# Patient Record
Sex: Female | Born: 1970 | Race: Black or African American | Hispanic: No | Marital: Single | Smoking: Never smoker
Health system: Southern US, Community
[De-identification: ages and names within clinical notes are randomized; demographics above are authoritative.]

---

## 2015-05-04 HISTORY — PX: OTHER SURGICAL HISTORY: SHX169

## 2021-01-03 ENCOUNTER — Encounter (HOSPITAL_COMMUNITY): Payer: Self-pay | Admitting: *Deleted

## 2021-01-03 ENCOUNTER — Other Ambulatory Visit: Payer: Self-pay

## 2021-01-03 ENCOUNTER — Emergency Department (HOSPITAL_COMMUNITY): Payer: No Typology Code available for payment source

## 2021-01-03 ENCOUNTER — Emergency Department (HOSPITAL_COMMUNITY)
Admission: EM | Admit: 2021-01-03 | Discharge: 2021-01-04 | Disposition: A | Discharge status: Home / Self Care | Payer: No Typology Code available for payment source | Attending: Emergency Medicine | Admitting: Emergency Medicine

## 2021-01-03 DIAGNOSIS — Y92410 Unspecified street and highway as the place of occurrence of the external cause: Secondary | ICD-10-CM | POA: Insufficient documentation

## 2021-01-03 DIAGNOSIS — M549 Dorsalgia, unspecified: Secondary | ICD-10-CM | POA: Diagnosis not present

## 2021-01-03 DIAGNOSIS — M25561 Pain in right knee: Secondary | ICD-10-CM

## 2021-01-03 MED ORDER — ACETAMINOPHEN 325 MG PO TABS
650.0000 mg | ORAL_TABLET | Freq: Once | ORAL | Status: AC
  Administered 2021-01-03: 650 mg via ORAL
  Filled 2021-01-03: qty 2
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
## 2021-01-04 MED ORDER — IBUPROFEN 400 MG PO TABS
600.0000 mg | ORAL_TABLET | Freq: Once | ORAL | Status: AC
  Administered 2021-01-04: 600 mg via ORAL
  Filled 2021-01-04: qty 1

## 2021-01-04 MED ORDER — IBUPROFEN 600 MG PO TABS: 600.0000 mg | ORAL_TABLET | Freq: Four times a day (QID) | ORAL | 0 refills | Status: AC | PRN

## 2021-01-04 MED ORDER — METHOCARBAMOL 500 MG PO TABS: 500.0000 mg | ORAL_TABLET | Freq: Two times a day (BID) | ORAL | 0 refills | Status: AC

## 2021-01-04 NOTE — Discharge Instructions (Addendum)
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
## 2021-02-01 ENCOUNTER — Encounter (HOSPITAL_BASED_OUTPATIENT_CLINIC_OR_DEPARTMENT_OTHER): Payer: Self-pay

## 2021-02-01 ENCOUNTER — Other Ambulatory Visit: Payer: Self-pay

## 2021-02-01 ENCOUNTER — Emergency Department (HOSPITAL_BASED_OUTPATIENT_CLINIC_OR_DEPARTMENT_OTHER)
Admission: EM | Admit: 2021-02-01 | Discharge: 2021-02-02 | Disposition: A | Discharge status: Home / Self Care | Payer: Self-pay | Attending: Emergency Medicine | Admitting: Emergency Medicine

## 2021-02-01 DIAGNOSIS — R350 Frequency of micturition: Secondary | ICD-10-CM | POA: Insufficient documentation

## 2021-02-01 LAB — URINALYSIS, ROUTINE W REFLEX MICROSCOPIC
Bilirubin Urine: NEGATIVE
Glucose, UA: NEGATIVE mg/dL
Hgb urine dipstick: NEGATIVE
Ketones, ur: NEGATIVE mg/dL
Leukocytes,Ua: NEGATIVE
Nitrite: NEGATIVE
Protein, ur: NEGATIVE mg/dL
Specific Gravity, Urine: 1.03 (ref 1.005–1.030)
pH: 5.5 (ref 5.0–8.0)

## 2021-02-01 NOTE — ED Triage Notes (Signed)
Pt reports urinary frequency x 2 wks - denies fever/  abd pain

## 2021-02-02 NOTE — ED Notes (Signed)
This RN presented the AVS utilizing Teachback Method. Patient verbalizes understanding of Discharge Instructions. Opportunity for Questioning and Answers were provided. Patient Discharged from ED ambulatory to Home.   

## 2021-02-02 NOTE — ED Provider Notes (Signed)
[facility] EMERGENCY DEPT Provider Note  CSN: [id number] Arrival date & time: 02/01/21 2259  Chief Complaint(s) Urinary Frequency  HPI Priscilla Little is a 50 y.o. female    Urinary Frequency This is a new problem. Episode onset: 2 week. The problem occurs daily. The problem has not changed since onset.Pertinent negatives include no chest pain, no abdominal pain, no headaches and no shortness of breath. Nothing aggravates the symptoms. Nothing relieves the symptoms. She has tried nothing for the symptoms.   Past Medical History History reviewed. No pertinent past medical history. There are no problems to display for this patient.  Home Medication(s) Prior to Admission medications   Medication Sig Start Date End Date Taking? Authorizing Provider  ibuprofen (ADVIL) 600 MG tablet Take 1 tablet (600 mg total) by mouth every 6 (six) hours as needed. 01/04/21   Dartha Lodge, PA-C  methocarbamol (ROBAXIN) 500 MG tablet Take 1 tablet (500 mg total) by mouth 2 (two) times daily. 01/04/21   Legrand Rams                                                                                                                                    Past Surgical History History reviewed. No pertinent surgical history. Family History No family history on file.  Social History Social History   Tobacco Use   Smoking status: Never   Smokeless tobacco: Never  Substance Use Topics   Alcohol use: Not Currently   Drug use: Not Currently   Allergies Patient has no known allergies.  Review of Systems Review of Systems  Respiratory:  Negative for shortness of breath.   Cardiovascular:  Negative for chest pain.  Gastrointestinal:  Negative for abdominal pain.  Genitourinary:  Positive for frequency.  Neurological:  Negative for headaches.  All other systems are reviewed and are negative for acute change except as noted in the HPI  Physical Exam Vital Signs  I have reviewed the  triage vital signs BP (!) 152/87 (BP Location: Right Arm)   Pulse 72   Temp 98.1 F (36.7 C) (Oral)   Resp 18   Ht 5\' 2"  (1.575 m)   Wt 74 kg   LMP 01/10/2021 (Exact Date)   SpO2 100%   BMI 29.84 kg/m   Physical Exam Vitals reviewed.  Constitutional:      General: She is not in acute distress.    Appearance: She is well-developed. She is not diaphoretic.  HENT:     Head: Normocephalic and atraumatic.     Right Ear: External ear normal.     Left Ear: External ear normal.     Nose: Nose normal.  Eyes:     General: No scleral icterus.    Conjunctiva/sclera: Conjunctivae normal.  Neck:     Trachea: Phonation normal.  Cardiovascular:     Rate and Rhythm: Normal rate and regular rhythm.  Pulmonary:  Effort: Pulmonary effort is normal. No respiratory distress.     Breath sounds: No stridor.  Abdominal:     General: There is no distension.     Tenderness: There is no abdominal tenderness.  Musculoskeletal:        General: Normal range of motion.     Cervical back: Normal range of motion.  Neurological:     Mental Status: She is alert and oriented to person, place, and time.  Psychiatric:        Behavior: Behavior normal.    ED Results and Treatments Labs (all labs ordered are listed, but only abnormal results are displayed) Labs Reviewed  URINALYSIS, ROUTINE W REFLEX MICROSCOPIC                                                                                                                         EKG  EKG Interpretation  Date/Time:    Ventricular Rate:    PR Interval:    QRS Duration:   QT Interval:    QTC Calculation:   R Axis:     Text Interpretation:         Radiology No results found.  Pertinent labs & imaging results that were available during my care of the patient were reviewed by me and considered in my medical decision making (see MDM for details).  Medications Ordered in ED Medications - No data to display                                                                                                                                    Procedures Procedures  (including critical care time)  Medical Decision Making / ED Course I have reviewed the nursing notes for this encounter and the patient's prior records (if available in EHR or on provided paperwork).  Priscilla Little was evaluated in Emergency Department on 02/02/2021 for the symptoms described in the history of present illness. She was evaluated in the context of the global COVID-19 pandemic, which necessitated consideration that the patient might be at risk for infection with the SARS-CoV-2 virus that causes COVID-19. Institutional protocols and algorithms that pertain to the evaluation of patients at risk for COVID-19 are in a state of rapid change based on information released by regulatory bodies including the CDC and federal and state organizations. These policies and algorithms were followed during the patient's care in the ED.  Urine frequency. No other urinary sxs. UA negative. Doubt diabetes. She declined UPT.  Pertinent labs & imaging results that were available during my care of the patient were reviewed by me and considered in my medical decision making:    Final Clinical Impression(s) / ED Diagnoses Final diagnoses:  Urine frequency   The patient appears reasonably screened and/or stabilized for discharge and I doubt any other medical condition or other [facility] requiring further screening, evaluation, or treatment in the ED at this time prior to discharge. Safe for discharge with strict return precautions.  Disposition: Discharge  Condition: Good  I have discussed the results, Dx and Tx plan with the patient/family who expressed understanding and agree(s) with the plan. Discharge instructions discussed at length. The patient/family was given strict return precautions who verbalized understanding of the instructions. No further questions at time of  discharge.    ED Discharge Orders     None        Follow Up: Primary care provider  Schedule an appointment as soon as possible for a visit  if you do not have a primary care physician, contact [facility] at [phone] for referral     This chart was dictated using voice recognition software.  Despite best efforts to proofread,  errors can occur which can change the documentation meaning.    Nira Conn, MD 02/02/21 Jacinta Shoe

## 2021-04-09 ENCOUNTER — Emergency Department (HOSPITAL_BASED_OUTPATIENT_CLINIC_OR_DEPARTMENT_OTHER)
Admission: EM | Admit: 2021-04-09 | Discharge: 2021-04-10 | Disposition: A | Discharge status: Home / Self Care | Payer: No Typology Code available for payment source | Attending: Emergency Medicine | Admitting: Emergency Medicine

## 2021-04-09 ENCOUNTER — Emergency Department (HOSPITAL_BASED_OUTPATIENT_CLINIC_OR_DEPARTMENT_OTHER): Payer: No Typology Code available for payment source

## 2021-04-09 ENCOUNTER — Emergency Department (HOSPITAL_BASED_OUTPATIENT_CLINIC_OR_DEPARTMENT_OTHER): Payer: No Typology Code available for payment source | Admitting: Radiology

## 2021-04-09 ENCOUNTER — Other Ambulatory Visit: Payer: Self-pay

## 2021-04-09 ENCOUNTER — Encounter (HOSPITAL_BASED_OUTPATIENT_CLINIC_OR_DEPARTMENT_OTHER): Payer: Self-pay

## 2021-04-09 DIAGNOSIS — M25512 Pain in left shoulder: Secondary | ICD-10-CM | POA: Diagnosis present

## 2021-04-09 DIAGNOSIS — M542 Cervicalgia: Secondary | ICD-10-CM | POA: Insufficient documentation

## 2021-04-09 DIAGNOSIS — Y92410 Unspecified street and highway as the place of occurrence of the external cause: Secondary | ICD-10-CM | POA: Diagnosis not present

## 2021-04-09 DIAGNOSIS — R519 Headache, unspecified: Secondary | ICD-10-CM | POA: Diagnosis not present

## 2021-04-09 LAB — CBC
HCT: 35.6 % — ABNORMAL LOW (ref 36.0–46.0)
Hemoglobin: 11.8 g/dL — ABNORMAL LOW (ref 12.0–15.0)
MCH: 31.5 pg (ref 26.0–34.0)
MCHC: 33.1 g/dL (ref 30.0–36.0)
MCV: 94.9 fL (ref 80.0–100.0)
Platelets: 274 10*3/uL (ref 150–400)
RBC: 3.75 MIL/uL — ABNORMAL LOW (ref 3.87–5.11)
RDW: 12 % (ref 11.5–15.5)
WBC: 9.4 10*3/uL (ref 4.0–10.5)
nRBC: 0 % (ref 0.0–0.2)

## 2021-04-09 LAB — URINALYSIS, ROUTINE W REFLEX MICROSCOPIC
Bilirubin Urine: NEGATIVE
Glucose, UA: NEGATIVE mg/dL
Hgb urine dipstick: NEGATIVE
Ketones, ur: NEGATIVE mg/dL
Leukocytes,Ua: NEGATIVE
Nitrite: NEGATIVE
Specific Gravity, Urine: 1.022 (ref 1.005–1.030)
pH: 8 (ref 5.0–8.0)

## 2021-04-09 LAB — PREGNANCY, URINE: Preg Test, Ur: NEGATIVE

## 2021-04-09 MED ORDER — KETOROLAC TROMETHAMINE 15 MG/ML IJ SOLN
15.0000 mg | Freq: Once | INTRAMUSCULAR | Status: AC
  Administered 2021-04-09: 15 mg via INTRAVENOUS
  Filled 2021-04-09: qty 1

## 2021-04-09 MED ORDER — DIAZEPAM 5 MG/ML IJ SOLN
5.0000 mg | Freq: Once | INTRAMUSCULAR | Status: AC
  Administered 2021-04-09: 5 mg via INTRAVENOUS
  Filled 2021-04-09: qty 2
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
## 2021-04-09 NOTE — ED Notes (Signed)
Pt gone to scans °
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
## 2021-04-10 LAB — COMPREHENSIVE METABOLIC PANEL
ALT: 16 U/L (ref 0–44)
AST: 18 U/L (ref 15–41)
Albumin: 4.2 g/dL (ref 3.5–5.0)
Alkaline Phosphatase: 78 U/L (ref 38–126)
Anion gap: 9 (ref 5–15)
BUN: 8 mg/dL (ref 6–20)
CO2: 28 mmol/L (ref 22–32)
Calcium: 9.5 mg/dL (ref 8.9–10.3)
Chloride: 102 mmol/L (ref 98–111)
Creatinine, Ser: 0.64 mg/dL (ref 0.44–1.00)
GFR, Estimated: >60 mL/min (ref >60)
Glucose, Bld: 116 mg/dL — ABNORMAL HIGH (ref 70–99)
Potassium: 3.2 mmol/L — ABNORMAL LOW (ref 3.5–5.1)
Sodium: 139 mmol/L (ref 135–145)
Total Bilirubin: 0.5 mg/dL (ref 0.3–1.2)
Total Protein: 7.9 g/dL (ref 6.5–8.1)

## 2021-07-17 ENCOUNTER — Encounter (HOSPITAL_BASED_OUTPATIENT_CLINIC_OR_DEPARTMENT_OTHER): Payer: Self-pay

## 2021-07-17 ENCOUNTER — Other Ambulatory Visit: Payer: Self-pay

## 2021-07-17 ENCOUNTER — Emergency Department (HOSPITAL_BASED_OUTPATIENT_CLINIC_OR_DEPARTMENT_OTHER)
Admission: EM | Admit: 2021-07-17 | Discharge: 2021-07-17 | Disposition: A | Discharge status: Home / Self Care | Payer: Managed Care, Other (non HMO) | Attending: Emergency Medicine | Admitting: Emergency Medicine

## 2021-07-17 DIAGNOSIS — N951 Menopausal and female climacteric states: Secondary | ICD-10-CM

## 2021-07-17 DIAGNOSIS — N958 Other specified menopausal and perimenopausal disorders: Secondary | ICD-10-CM | POA: Diagnosis not present

## 2021-07-17 DIAGNOSIS — R350 Frequency of micturition: Secondary | ICD-10-CM | POA: Diagnosis present

## 2021-07-17 DIAGNOSIS — R61 Generalized hyperhidrosis: Secondary | ICD-10-CM | POA: Diagnosis not present

## 2021-07-17 LAB — URINALYSIS, ROUTINE W REFLEX MICROSCOPIC
Bilirubin Urine: NEGATIVE
Glucose, UA: NEGATIVE mg/dL
Hgb urine dipstick: NEGATIVE
Ketones, ur: 15 mg/dL — AB
Leukocytes,Ua: NEGATIVE
Nitrite: NEGATIVE
Specific Gravity, Urine: 1.029 (ref 1.005–1.030)
pH: 6 (ref 5.0–8.0)

## 2021-07-17 LAB — PREGNANCY, URINE: Preg Test, Ur: NEGATIVE

## 2021-07-17 NOTE — ED Triage Notes (Signed)
Pt presents POV with c/o of urinary frequency for several months. ? ?Has some intermittent pain and burning with urination. ? ?Pt ambulatory and in NAD in triage. ? ? ?

## 2021-07-17 NOTE — ED Provider Notes (Signed)
? ?  [facility]  ?Provider Note ? ?CSN: [id number] ?Arrival date & time: 07/17/21 0006 ? ?History ?Chief Complaint  ?Patient presents with  ? Urinary Frequency  ? ? ?Priscilla Little is a 51 y.o. female reports urinary frequency and vaginal itching/dryness ongoing for several weeks. She saw Ob/Gyn, treated for possible yeast infection and referred to Urology who gave her medications for overactive bladder without improvement. She has also noticed mood swings, night sweats and is concerned she is perimenopausal. No fevers, no vomiting, occasional nausea.  ? ? ?Home Medications ?Prior to Admission medications   ?Medication Sig Start Date End Date Taking? Authorizing Provider  ?ibuprofen (ADVIL) 600 MG tablet Take 1 tablet (600 mg total) by mouth every 6 (six) hours as needed. 01/04/21   Dartha Lodge, PA-C  ?methocarbamol (ROBAXIN) 500 MG tablet Take 1 tablet (500 mg total) by mouth 2 (two) times daily. 01/04/21   Dartha Lodge, PA-C  ? ? ? ?Allergies    ?Patient has no known allergies. ? ? ?Review of Systems   ?Review of Systems ?Please see HPI for pertinent positives and negatives ? ?Physical Exam ?BP (!) 156/85 (BP Location: Right Arm)   Pulse 84   Temp 98.4 ?F (36.9 ?C)   Resp 18   Ht 5' 2.5" (1.588 m)   Wt 73.9 kg   LMP 06/20/2021 (Exact Date)   SpO2 100%   BMI 29.34 kg/m?  ? ?Physical Exam ?Vitals and nursing note reviewed.  ?HENT:  ?   Head: Normocephalic.  ?   Nose: Nose normal.  ?Eyes:  ?   Extraocular Movements: Extraocular movements intact.  ?Pulmonary:  ?   Effort: Pulmonary effort is normal.  ?Musculoskeletal:     ?   General: Normal range of motion.  ?   Cervical back: Neck supple.  ?Skin: ?   Findings: No rash (on exposed skin).  ?Neurological:  ?   Mental Status: She is alert and oriented to person, place, and time.  ?Psychiatric:     ?   Mood and Affect: Mood normal.  ? ? ?ED Results / Procedures / Treatments   ?EKG ?None ? ?Procedures ?Procedures ? ?Medications Ordered  in the ED ?Medications - No data to display ? ?Initial Impression and Plan ? Patient with symptoms consistent with perimenopausal. UA is neg for infection. She was encouraged to bring her concerns back to her Gyn for further management. No further ED workup is indicated.  ? ?ED Course  ? ?  ? ? ?MDM Rules/Calculators/A&P ?Medical Decision Making ?Problems Addressed: ?Perimenopausal: acute illness or injury ?Urinary frequency: chronic illness or injury ? ?Amount and/or Complexity of Data Reviewed ?Labs: ordered. Decision-making details documented in ED Course. ? ? ? ?Final Clinical Impression(s) / ED Diagnoses ?Final diagnoses:  ?Urinary frequency  ?Perimenopausal  ? ? ?Rx / DC Orders ?ED Discharge Orders   ? ? None  ? ?  ? ?  ?Pollyann Savoy, MD ?07/17/21 [phone] ? ?

## 2021-08-21 ENCOUNTER — Ambulatory Visit
Admission: RE | Admit: 2021-08-21 | Discharge: 2021-08-21 | Disposition: A | Discharge status: Home / Self Care | Payer: Managed Care, Other (non HMO) | Source: Ambulatory Visit | Attending: Family Medicine | Admitting: Family Medicine

## 2021-08-21 ENCOUNTER — Other Ambulatory Visit: Payer: Self-pay | Admitting: Family Medicine

## 2021-08-21 DIAGNOSIS — M545 Low back pain, unspecified: Secondary | ICD-10-CM

## 2022-01-30 ENCOUNTER — Encounter (HOSPITAL_BASED_OUTPATIENT_CLINIC_OR_DEPARTMENT_OTHER): Payer: Self-pay

## 2022-01-30 ENCOUNTER — Other Ambulatory Visit: Payer: Self-pay

## 2022-01-30 DIAGNOSIS — R300 Dysuria: Secondary | ICD-10-CM | POA: Diagnosis not present

## 2022-01-30 DIAGNOSIS — R350 Frequency of micturition: Secondary | ICD-10-CM | POA: Insufficient documentation

## 2022-01-30 LAB — URINALYSIS, ROUTINE W REFLEX MICROSCOPIC
Bilirubin Urine: NEGATIVE
Glucose, UA: NEGATIVE mg/dL
Hgb urine dipstick: NEGATIVE
Ketones, ur: NEGATIVE mg/dL
Leukocytes,Ua: NEGATIVE
Nitrite: NEGATIVE
Protein, ur: NEGATIVE mg/dL
Specific Gravity, Urine: 1.02 (ref 1.005–1.030)
pH: 7 (ref 5.0–8.0)

## 2022-01-30 LAB — PREGNANCY, URINE: Preg Test, Ur: NEGATIVE

## 2022-01-30 NOTE — ED Triage Notes (Signed)
Pt having bladder symptoms Dysuria, urgency and slight burning  Hx of fibroids "I feel like something is sitting on my bladder"

## 2022-01-31 ENCOUNTER — Emergency Department (HOSPITAL_BASED_OUTPATIENT_CLINIC_OR_DEPARTMENT_OTHER)
Admission: EM | Admit: 2022-01-31 | Discharge: 2022-01-31 | Disposition: A | Discharge status: Home / Self Care | Payer: Commercial Managed Care - HMO | Attending: Emergency Medicine | Admitting: Emergency Medicine

## 2022-01-31 DIAGNOSIS — R350 Frequency of micturition: Secondary | ICD-10-CM

## 2022-01-31 NOTE — ED Provider Notes (Signed)
[facility] Provider Note  CSN: [id number] Arrival date & time: 01/30/22 2209  Chief Complaint(s) Dysuria  HPI Priscilla Little is a 51 y.o. female     Dysuria Quality: pressure. Pain severity:  Mild Duration: 1 year. Timing:  Intermittent Progression:  Waxing and waning Chronicity:  Chronic Relieved by: voiding. Worsened by:  Nothing Urinary symptoms: frequent urination   Urinary symptoms: no discolored urine, no foul-smelling urine, no hesitancy and no bladder incontinence   Associated symptoms: no abdominal pain   Risk factors: not pregnant     Past Medical History History reviewed. No pertinent past medical history. There are no problems to display for this patient.  Home Medication(s) Prior to Admission medications   Medication Sig Start Date End Date Taking? Authorizing Provider  ibuprofen (ADVIL) 600 MG tablet Take 1 tablet (600 mg total) by mouth every 6 (six) hours as needed. 01/04/21   Dartha Lodge, PA-C  methocarbamol (ROBAXIN) 500 MG tablet Take 1 tablet (500 mg total) by mouth 2 (two) times daily. 01/04/21   Dartha Lodge, PA-C                                                                                                                                    Allergies Patient has no known allergies.  Review of Systems Review of Systems  Gastrointestinal:  Negative for abdominal pain.  Genitourinary:  Positive for dysuria.   As noted in HPI  Physical Exam Vital Signs  I have reviewed the triage vital signs BP (!) 150/89 (BP Location: Right Arm)   Pulse 79   Temp (!) 97.5 F (36.4 C) (Oral)   Resp 16   Ht 5\' 2"  (1.575 m)   Wt 77.1 kg   LMP 12/24/2021 (Exact Date)   SpO2 99%   BMI 31.09 kg/m   Physical Exam Vitals reviewed.  Constitutional:      General: She is not in acute distress.    Appearance: She is well-developed. She is not diaphoretic.  HENT:     Head: Normocephalic and atraumatic.     Right Ear: External  ear normal.     Left Ear: External ear normal.     Nose: Nose normal.  Eyes:     General: No scleral icterus.    Conjunctiva/sclera: Conjunctivae normal.  Neck:     Trachea: Phonation normal.  Cardiovascular:     Rate and Rhythm: Normal rate and regular rhythm.  Pulmonary:     Effort: Pulmonary effort is normal. No respiratory distress.     Breath sounds: No stridor.  Abdominal:     General: There is no distension.     Tenderness: There is no abdominal tenderness. There is no guarding or rebound.  Musculoskeletal:        General: Normal range of motion.     Cervical back: Normal range of motion.  Neurological:     Mental Status:  She is alert and oriented to person, place, and time.  Psychiatric:        Behavior: Behavior normal.     ED Results and Treatments Labs (all labs ordered are listed, but only abnormal results are displayed) Labs Reviewed  URINALYSIS, ROUTINE W REFLEX MICROSCOPIC  PREGNANCY, URINE                                                                                                                         EKG  EKG Interpretation  Date/Time:    Ventricular Rate:    PR Interval:    QRS Duration:   QT Interval:    QTC Calculation:   R Axis:     Text Interpretation:         Radiology No results found.  Medications Ordered in ED Medications - No data to display                                                                                                                                   Procedures Procedures  (including critical care time)  Medical Decision Making / ED Course   Medical Decision Making Amount and/or Complexity of Data Reviewed External Data Reviewed: notes.    Details: Ob/Gyn clinic notes mentioning working patient up for urinary frequency as well as following up with Urology. Labs: ordered. Decision-making details documented in ED Course.   Urinary frequency and abd discomfort relief with voiding UPT ruling out  pregnancy related process. UA w/o infection. No need for emergent ultrasound or imaging at this time.  It appears that patient is being followed by OB/GYN and urology for this already.  Recommend continued follow-up with them for further work-up and management.       Final Clinical Impression(s) / ED Diagnoses Final diagnoses:  Urinary frequency   The patient appears reasonably screened and/or stabilized for discharge and I doubt any other medical condition or other [facility] requiring further screening, evaluation, or treatment in the ED at this time. I have discussed the findings, Dx and Tx plan with the patient/family who expressed understanding and agree(s) with the plan. Discharge instructions discussed at length. The patient/family was given strict return precautions who verbalized understanding of the instructions. No further questions at time of discharge.  Disposition: Discharge  Condition: Good  ED Discharge Orders     None        Follow Up: Primary  care provider  Call  to schedule an appointment for close follow up           This chart was dictated using voice recognition software.  Despite best efforts to proofread,  errors can occur which can change the documentation meaning.    Fatima Blank, MD 01/31/22 [phone]

## 2022-01-31 NOTE — ED Notes (Signed)
Pt care taken, resting no complaints at this time. 

## 2022-08-17 IMAGING — CR DG LUMBAR SPINE COMPLETE 4+V
5 series · 5 of 5 positions shown · non-contrast
Comparison: None.

CLINICAL DATA: Chronic low back pain and upper buttock pain for
greater than 30 years

EXAM:
LUMBAR SPINE - COMPLETE 4+ VIEW

[t l-spine a.p.]
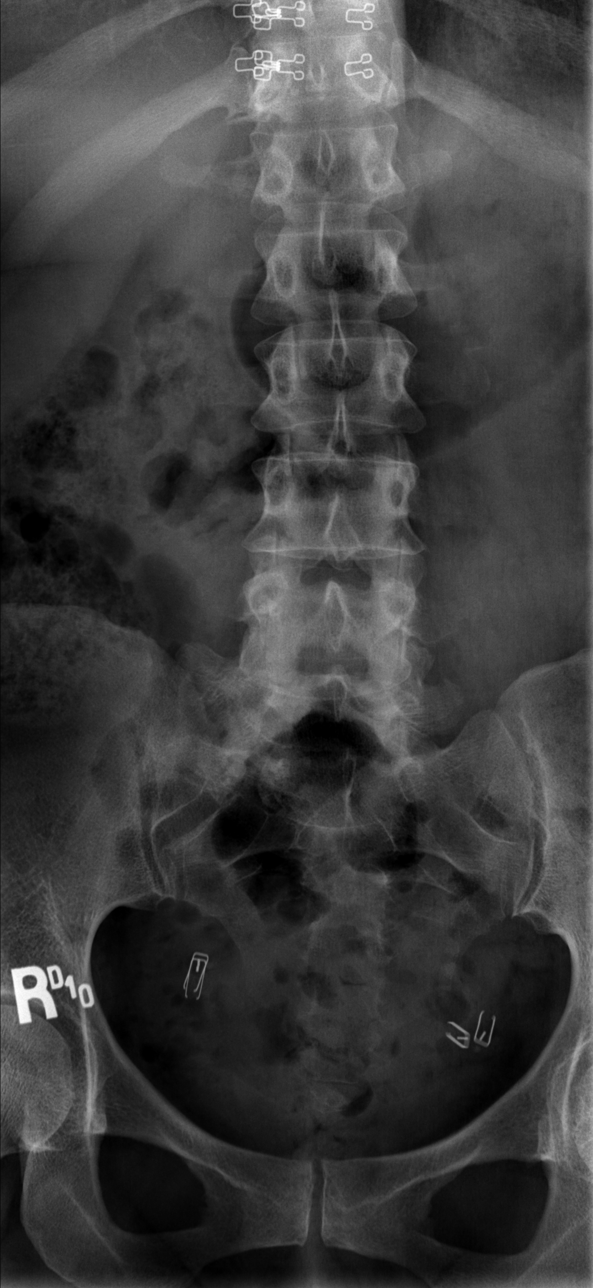

[t l-spine oblique exposure (1 of 2)]
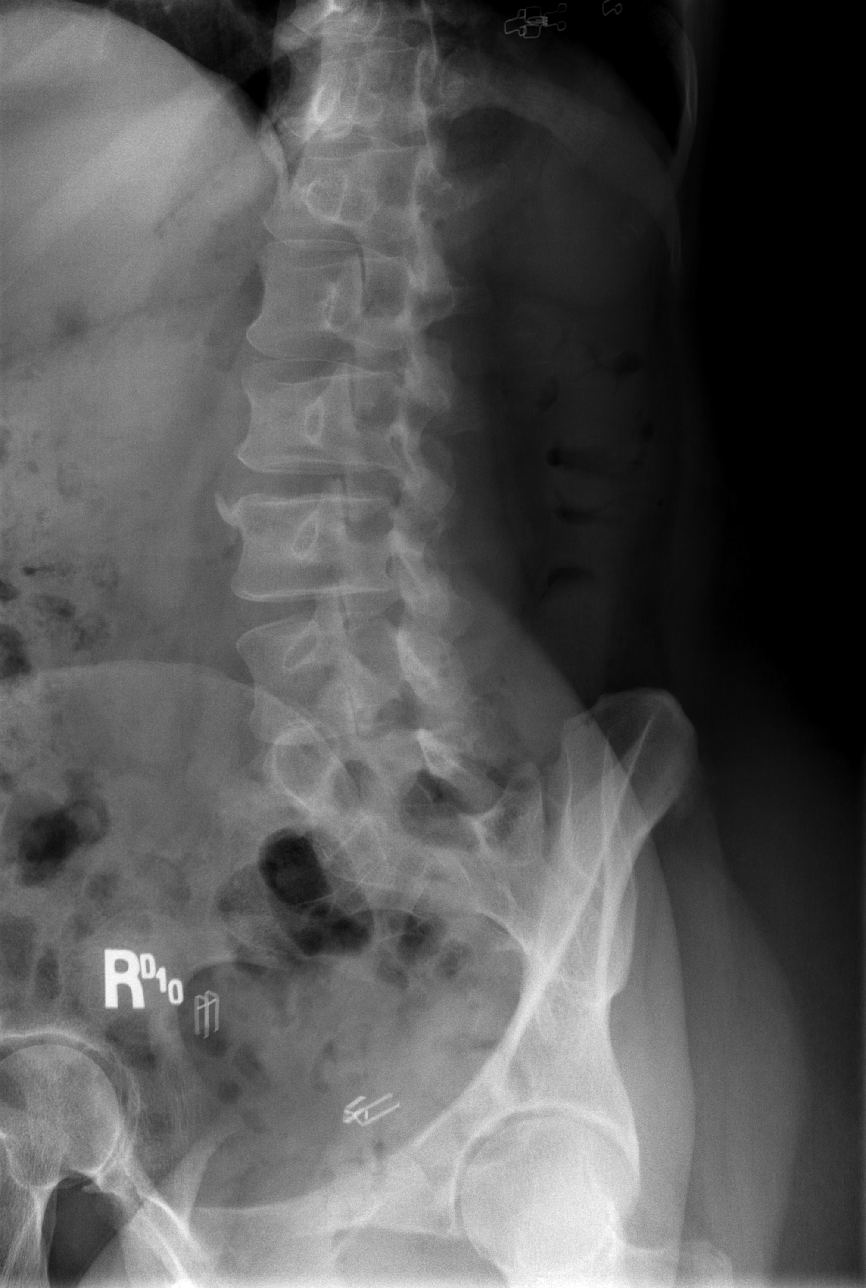

[t l-spine oblique exposure (2 of 2)]
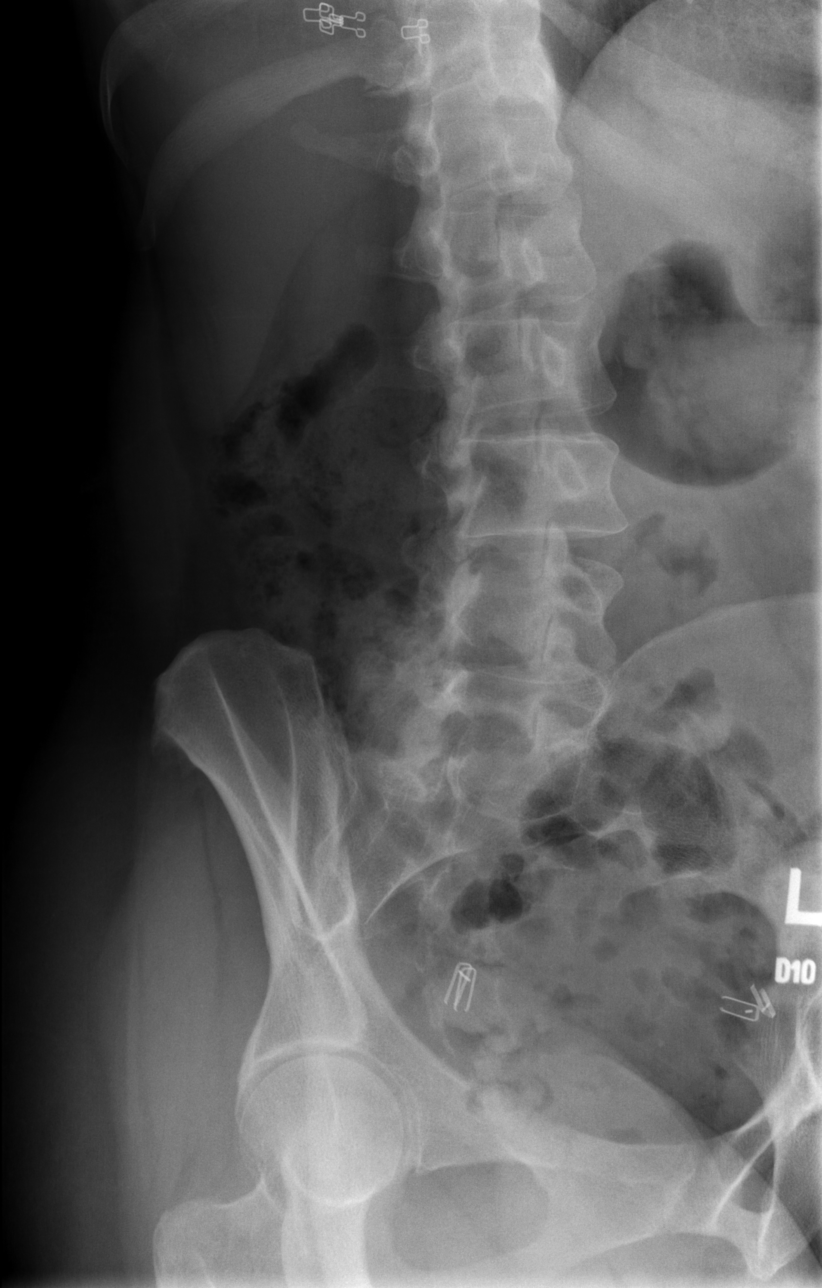

[t l-spine lat]
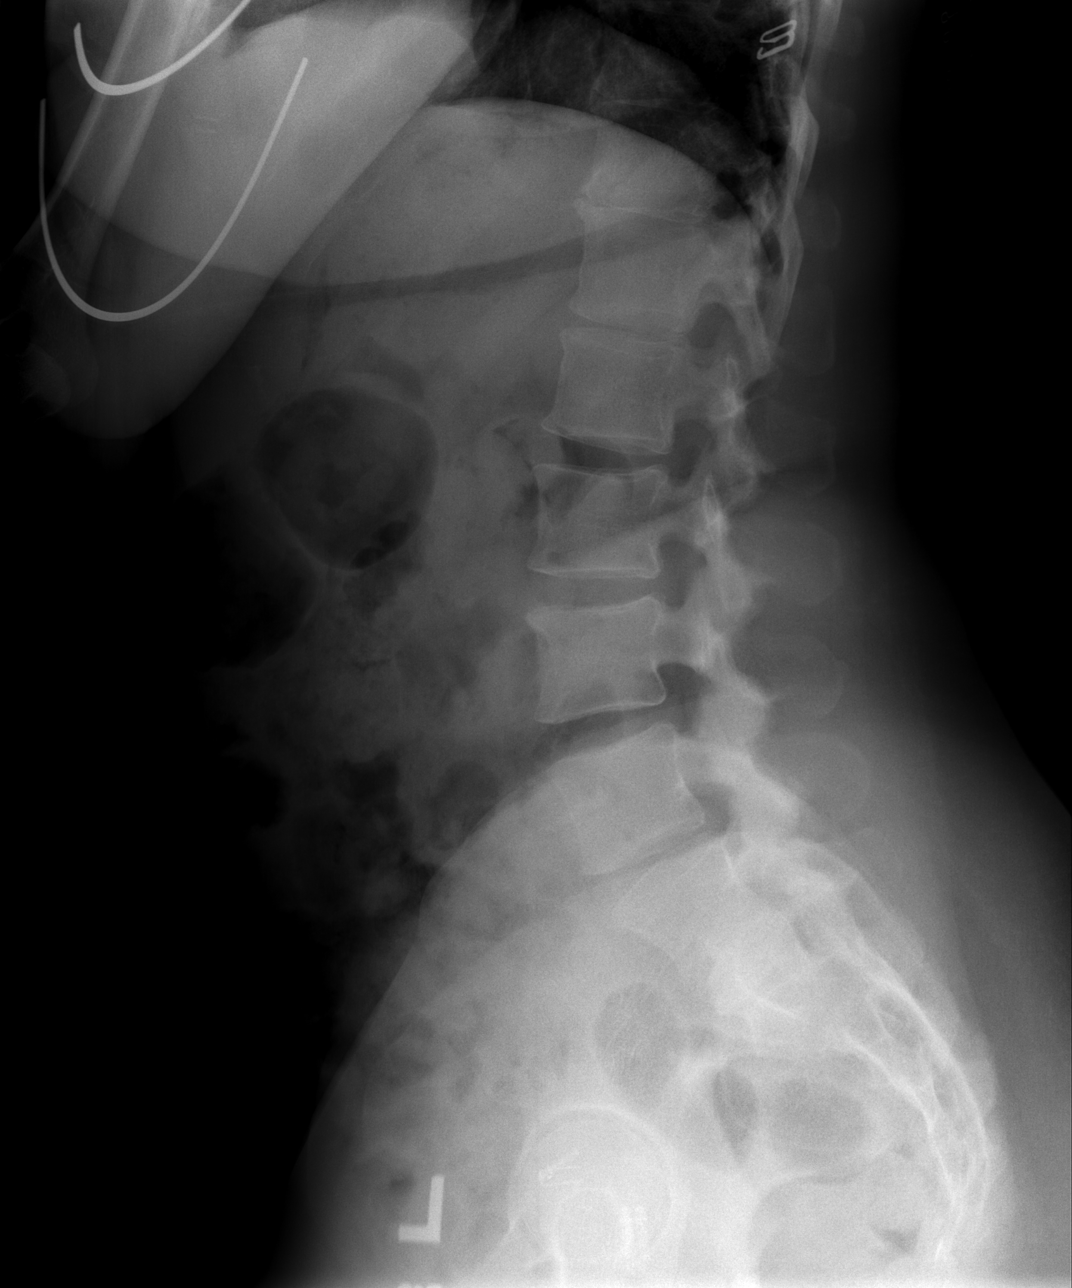

[t l-spine l5-s1 spot]
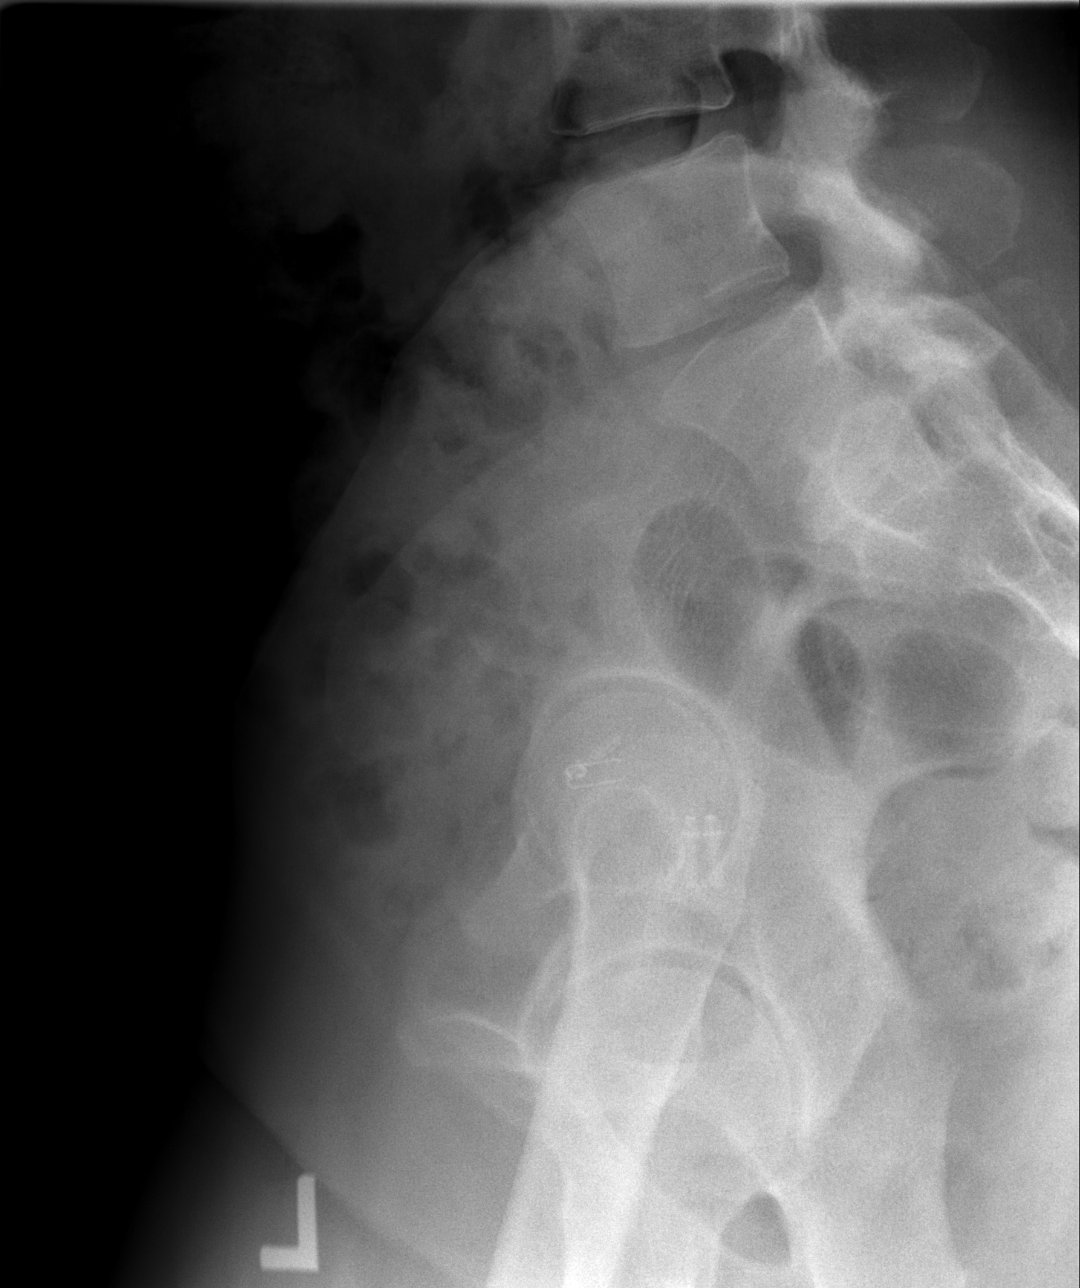

[5 of 5 positions shown; findings below may reference images not displayed]

FINDINGS: Frontal, bilateral oblique, lateral views of the lumbar spine are
obtained. There are 5 non-rib-bearing lumbar type vertebral bodies
in grossly normal alignment. No acute displaced fractures. Mild
facet hypertrophic changes at the lumbosacral junction. Prominent
spondylosis at the thoracolumbar junction, most pronounced at
T12/L1. Sacroiliac joints are grossly normal. Tubal ligation clips
within the pelvis.
IMPRESSION: 1. Prominent spondylosis at the thoracolumbar junction.
2. Mild facet hypertrophy at the lumbosacral junction.
3. No acute bony abnormality.
